# Patient Record
Sex: Male | Born: 1985 | Race: White | Hispanic: No | Marital: Married | Smoking: Current every day smoker
Health system: Southern US, Community
[De-identification: ages and names within clinical notes are randomized; demographics above are authoritative.]

## PROBLEM LIST (undated history)

## (undated) HISTORY — PX: TYMPANOSTOMY TUBE PLACEMENT: SHX32

---

## 2010-10-26 ENCOUNTER — Emergency Department (INDEPENDENT_AMBULATORY_CARE_PROVIDER_SITE_OTHER): Payer: Self-pay

## 2010-10-26 ENCOUNTER — Emergency Department (HOSPITAL_BASED_OUTPATIENT_CLINIC_OR_DEPARTMENT_OTHER)
Admission: EM | Admit: 2010-10-26 | Discharge: 2010-10-26 | Disposition: A | Discharge status: Home / Self Care | Payer: Self-pay | Attending: Emergency Medicine | Admitting: Emergency Medicine

## 2010-10-26 DIAGNOSIS — R0789 Other chest pain: Secondary | ICD-10-CM | POA: Insufficient documentation

## 2010-10-26 DIAGNOSIS — R079 Chest pain, unspecified: Secondary | ICD-10-CM

## 2010-10-26 DIAGNOSIS — R071 Chest pain on breathing: Secondary | ICD-10-CM | POA: Insufficient documentation

## 2010-10-26 LAB — CK TOTAL AND CKMB (NOT AT ARMC)
CK, MB: 1.4 ng/mL (ref 0.3–4.0)
Relative Index: INVALID (ref 0.0–2.5)

## 2010-10-26 LAB — COMPREHENSIVE METABOLIC PANEL
AST: 20 U/L (ref 0–37)
Albumin: 4.6 g/dL (ref 3.5–5.2)
Calcium: 10.2 mg/dL (ref 8.4–10.5)
Creatinine, Ser: 0.9 mg/dL (ref 0.4–1.5)
Sodium: 139 mEq/L (ref 135–145)
Total Protein: 7.4 g/dL (ref 6.0–8.3)

## 2010-10-26 LAB — TROPONIN I: Troponin I: <0.3 ng/mL (ref <0.30)

## 2010-10-26 LAB — CBC
MCH: 30.8 pg (ref 26.0–34.0)
Platelets: 259 10*3/uL (ref 150–400)
RBC: 5.2 MIL/uL (ref 4.22–5.81)
WBC: 7.1 10*3/uL (ref 4.0–10.5)

## 2010-11-03 ENCOUNTER — Emergency Department (HOSPITAL_BASED_OUTPATIENT_CLINIC_OR_DEPARTMENT_OTHER)
Admission: EM | Admit: 2010-11-03 | Discharge: 2010-11-03 | Disposition: A | Discharge status: Home / Self Care | Payer: Self-pay | Attending: Emergency Medicine | Admitting: Emergency Medicine

## 2010-11-03 DIAGNOSIS — R0789 Other chest pain: Secondary | ICD-10-CM | POA: Insufficient documentation

## 2010-11-03 DIAGNOSIS — R071 Chest pain on breathing: Secondary | ICD-10-CM | POA: Insufficient documentation

## 2010-11-03 DIAGNOSIS — F17200 Nicotine dependence, unspecified, uncomplicated: Secondary | ICD-10-CM | POA: Insufficient documentation

## 2012-11-28 ENCOUNTER — Emergency Department (HOSPITAL_BASED_OUTPATIENT_CLINIC_OR_DEPARTMENT_OTHER): Payer: Self-pay

## 2012-11-28 ENCOUNTER — Encounter (HOSPITAL_BASED_OUTPATIENT_CLINIC_OR_DEPARTMENT_OTHER): Payer: Self-pay | Admitting: *Deleted

## 2012-11-28 ENCOUNTER — Emergency Department (HOSPITAL_BASED_OUTPATIENT_CLINIC_OR_DEPARTMENT_OTHER)
Admission: EM | Admit: 2012-11-28 | Discharge: 2012-11-28 | Disposition: A | Discharge status: Home / Self Care | Payer: Self-pay | Attending: Emergency Medicine | Admitting: Emergency Medicine

## 2012-11-28 DIAGNOSIS — F17200 Nicotine dependence, unspecified, uncomplicated: Secondary | ICD-10-CM | POA: Insufficient documentation

## 2012-11-28 DIAGNOSIS — M25579 Pain in unspecified ankle and joints of unspecified foot: Secondary | ICD-10-CM | POA: Insufficient documentation

## 2012-11-28 DIAGNOSIS — M79671 Pain in right foot: Secondary | ICD-10-CM

## 2012-11-28 MED ORDER — IBUPROFEN 800 MG PO TABS: 800.0000 mg | ORAL_TABLET | Freq: Three times a day (TID) | ORAL | Status: DC

## 2012-11-28 MED ORDER — IBUPROFEN 800 MG PO TABS
800.0000 mg | ORAL_TABLET | Freq: Once | ORAL | Status: AC
  Administered 2012-11-28: 800 mg via ORAL
  Filled 2012-11-28: qty 1

## 2012-11-28 MED ORDER — HYDROCODONE-ACETAMINOPHEN 5-325 MG PO TABS: 2.0000 | ORAL_TABLET | ORAL | Status: DC | PRN

## 2012-11-28 NOTE — ED Provider Notes (Signed)
History  This chart was scribed for Ian Octave, MD, by Candelaria Stagers, ED Scribe. This patient was seen in room MH03/[facility]03 and the patient's care was started at 3:09 PM  CSN: [id number] Arrival date & time 11/28/12  1412  First MD Initiated Contact with Patient 11/28/12 1458     Chief Complaint  Patient presents with  . Foot Pain    The history is provided by the patient. No language interpreter was used.   HPI Comments: Ian Villanueva is a 27 y.o. male who presents to the Emergency Department complaining of sudden onset of right heel pain that started yesterday while at work.  He reports the pain is worse with weight bearing.  Pt denies injury, trauma, or heavy lifting.  He has taken nothing for the pain.  Nothing seems to improve or worsen sx.  He has no h/o heel spurs or injury to right foot.   History reviewed. No pertinent past medical history. History reviewed. No pertinent past surgical history. History reviewed. No pertinent family history. History  Substance Use Topics  . Smoking status: Current Every Day Smoker  . Smokeless tobacco: Not on file  . Alcohol Use: Yes     Comment: occ    Review of Systems  Musculoskeletal: Positive for arthralgias (right heel pain).  All other systems reviewed and are negative.    Allergies  Review of patient's allergies indicates no known allergies.  Home Medications   Current Outpatient Rx  Name  Route  Sig  Dispense  Refill  . HYDROcodone-acetaminophen (NORCO/VICODIN) 5-325 MG per tablet   Oral   Take 2 tablets by mouth every 4 (four) hours as needed for pain.   10 tablet   0   . ibuprofen (ADVIL,MOTRIN) 800 MG tablet   Oral   Take 1 tablet (800 mg total) by mouth 3 (three) times daily.   21 tablet   0    BP 146/88  Pulse 84  Temp(Src) 97.9 F (36.6 C) (Oral)  Resp 20  Ht 5\' 9"  (1.753 m)  Wt 195 lb (88.451 kg)  BMI 28.78 kg/m2  SpO2 99% Physical Exam  Nursing note and vitals reviewed. Constitutional:  He is oriented to person, place, and time. He appears well-developed and well-nourished. No distress.  HENT:  Head: Normocephalic and atraumatic.  Eyes: EOM are normal.  Neck: Neck supple. No tracheal deviation present.  Cardiovascular: Normal rate.   Pulmonary/Chest: Effort normal. No respiratory distress.  Musculoskeletal: Normal range of motion.  No skin changes.  Tender to palpation over right heel.  Achilles tendon intact.  +2 DP and PT pulse.  Able to wiggle toes.    Neurological: He is alert and oriented to person, place, and time.  Skin: Skin is warm and dry.  Psychiatric: He has a normal mood and affect. His behavior is normal.    ED Course  Procedures  DIAGNOSTIC STUDIES: Oxygen Saturation is 99% on room air, normal by my interpretation.    COORDINATION OF CARE:  3:10 PM Discussed course of care with pt which includes images of right foot.  Pt understands and agrees.   4:07 PM Discussed images with pt.    Labs Reviewed - No data to display Dg Foot Complete Right  11/28/2012   *RADIOLOGY REPORT*  Clinical Data: Right foot pain beginning 1 day ago.  RIGHT FOOT COMPLETE - 3+ VIEW  Comparison: None.  Findings: No acute bony or joint abnormality is identified.  Mild first MTP degenerative change  is seen.  Soft tissue structures are unremarkable.  IMPRESSION:  1.  No acute finding.  2.  Mild first MTP degenerative disease.   Original Report Authenticated By: Holley Dexter, M.D.   1. Heel pain, right     MDM  2 days of atraumatic right heel pain without injury. Worse with weightbearing. No fevers, chills, vomiting, or other joint pain.  No abnormalities on exam. Neurovascular intact. Suspect calcaneal spur versus plantar fasciitis  X-ray negative. Will treat supportively for possible fasciitis. Patient given postop shoe anti-inflammatories, sports medicine followup.   I personally performed the services described in this documentation, which was scribed in my presence.  The recorded information has been reviewed and is accurate.    Ian Octave, MD 11/28/12 [phone]

## 2012-11-28 NOTE — ED Notes (Signed)
Right heel pain onset yesterday denies injury

## 2012-12-23 ENCOUNTER — Emergency Department (HOSPITAL_BASED_OUTPATIENT_CLINIC_OR_DEPARTMENT_OTHER)
Admission: EM | Admit: 2012-12-23 | Discharge: 2012-12-23 | Disposition: A | Discharge status: Home / Self Care | Payer: Self-pay | Attending: Emergency Medicine | Admitting: Emergency Medicine

## 2012-12-23 ENCOUNTER — Encounter (HOSPITAL_BASED_OUTPATIENT_CLINIC_OR_DEPARTMENT_OTHER): Payer: Self-pay | Admitting: *Deleted

## 2012-12-23 DIAGNOSIS — IMO0002 Reserved for concepts with insufficient information to code with codable children: Secondary | ICD-10-CM | POA: Insufficient documentation

## 2012-12-23 DIAGNOSIS — Y939 Activity, unspecified: Secondary | ICD-10-CM | POA: Insufficient documentation

## 2012-12-23 DIAGNOSIS — T798XXA Other early complications of trauma, initial encounter: Secondary | ICD-10-CM

## 2012-12-23 DIAGNOSIS — S91309A Unspecified open wound, unspecified foot, initial encounter: Secondary | ICD-10-CM | POA: Insufficient documentation

## 2012-12-23 DIAGNOSIS — Y929 Unspecified place or not applicable: Secondary | ICD-10-CM | POA: Insufficient documentation

## 2012-12-23 DIAGNOSIS — F17200 Nicotine dependence, unspecified, uncomplicated: Secondary | ICD-10-CM | POA: Insufficient documentation

## 2012-12-23 MED ORDER — SULFAMETHOXAZOLE-TRIMETHOPRIM 800-160 MG PO TABS: 1.0000 | ORAL_TABLET | Freq: Two times a day (BID) | ORAL | Status: DC

## 2012-12-23 MED ORDER — HYDROCODONE-ACETAMINOPHEN 5-325 MG PO TABS
2.0000 | ORAL_TABLET | Freq: Once | ORAL | Status: AC
  Administered 2012-12-23: 2 via ORAL
  Filled 2012-12-23: qty 2

## 2012-12-23 MED ORDER — HYDROCODONE-ACETAMINOPHEN 5-325 MG PO TABS: 2.0000 | ORAL_TABLET | ORAL | Status: DC | PRN

## 2012-12-23 NOTE — ED Notes (Signed)
Pt c/o right top of foot pain x 6 hrs  , redness noted to small abrasion

## 2012-12-23 NOTE — ED Provider Notes (Signed)
  CSN: [id number]     Arrival date & time 12/23/12  1311 History     First MD Initiated Contact with Patient 12/23/12 1318     Chief Complaint  Patient presents with  . Foot Pain   (Consider location/radiation/quality/duration/timing/severity/associated sxs/prior Treatment) HPI Comments: Patient presents to the ER for evaluation of pain and swelling of his right foot. Patient reports that he hit his foot on a bed frame yesterday and scraped the top of the foot. Today he has noticed some swelling around the site and the pain has progressively worsened. He reports that it hurts when he tries to flex his foot.  Patient is a 27 y.o. male presenting with lower extremity pain.  Foot Pain    History reviewed. No pertinent past medical history. Past Surgical History  Procedure Laterality Date  . Tympanostomy tube placement     History reviewed. No pertinent family history. History  Substance Use Topics  . Smoking status: Current Every Day Smoker -- 0.50 packs/day    Types: Cigarettes  . Smokeless tobacco: Not on file  . Alcohol Use: Yes     Comment: occ    Review of Systems  Musculoskeletal: Positive for joint swelling.  Skin: Positive for wound.    Allergies  Review of patient's allergies indicates no known allergies.  Home Medications  No current outpatient prescriptions on file. BP 146/89  Pulse 80  Temp(Src) 97.9 F (36.6 C) (Oral)  Resp 18  Ht 5\' 9"  (1.753 m)  Wt 195 lb (88.451 kg)  BMI 28.78 kg/m2  SpO2 99% Physical Exam  Constitutional: He is oriented to person, place, and time.  HENT:  Right Ear: Hearing normal.  Left Ear: Hearing normal.  Nose: Nose normal.  Mouth/Throat: Mucous membranes are normal.  Cardiovascular: S1 normal and S2 normal.   Pulmonary/Chest: Effort normal.  Abdominal: Normal appearance. There is no hepatosplenomegaly. There is no tenderness at McBurney's point and negative Murphy's sign. No hernia.  Musculoskeletal: Normal range of  motion.  Neurological: He is alert and oriented to person, place, and time. He has normal strength. No cranial nerve deficit or sensory deficit. Coordination normal. GCS eye subscore is 4. GCS verbal subscore is 5. GCS motor subscore is 6.  Skin: Skin is warm, dry and intact. No rash noted. No cyanosis.     Psychiatric: He has a normal mood and affect. His speech is normal and behavior is normal. Thought content normal.    ED Course   Procedures (including critical care time)  Labs Reviewed - No data to display No results found.  Diagnosis: Cellulitis, wound infection  MDM  Presents to the ER for evaluation of pain and swelling of his right foot. Patient suffered a superficial abrasion yesterday and now there is some surrounding erythema and pain. Pain significantly worsens if he tries to actively flex the ankle. There does not appear to be any abscess or deep infection. Symptoms are consistent with a cellulitis/wound infection. Patient to be initiated on Bactrim, pain medication and rest.  Gilda Crease, MD 12/23/12 [phone]

## 2013-01-10 ENCOUNTER — Encounter (HOSPITAL_BASED_OUTPATIENT_CLINIC_OR_DEPARTMENT_OTHER): Payer: Self-pay | Admitting: *Deleted

## 2013-01-10 ENCOUNTER — Emergency Department (HOSPITAL_BASED_OUTPATIENT_CLINIC_OR_DEPARTMENT_OTHER): Payer: Self-pay

## 2013-01-10 ENCOUNTER — Emergency Department (HOSPITAL_BASED_OUTPATIENT_CLINIC_OR_DEPARTMENT_OTHER)
Admission: EM | Admit: 2013-01-10 | Discharge: 2013-01-10 | Disposition: A | Discharge status: Home / Self Care | Payer: Self-pay | Attending: Emergency Medicine | Admitting: Emergency Medicine

## 2013-01-10 DIAGNOSIS — M109 Gout, unspecified: Secondary | ICD-10-CM | POA: Insufficient documentation

## 2013-01-10 DIAGNOSIS — R609 Edema, unspecified: Secondary | ICD-10-CM | POA: Insufficient documentation

## 2013-01-10 DIAGNOSIS — F17200 Nicotine dependence, unspecified, uncomplicated: Secondary | ICD-10-CM | POA: Insufficient documentation

## 2013-01-10 LAB — BASIC METABOLIC PANEL
CO2: 27 mEq/L (ref 19–32)
Chloride: 103 mEq/L (ref 96–112)
Creatinine, Ser: 0.8 mg/dL (ref 0.50–1.35)
Sodium: 139 mEq/L (ref 135–145)

## 2013-01-10 LAB — CBC WITH DIFFERENTIAL/PLATELET
Basophils Absolute: 0 10*3/uL (ref 0.0–0.1)
HCT: 40 % (ref 39.0–52.0)
Hemoglobin: 14.5 g/dL (ref 13.0–17.0)
Lymphocytes Relative: 31 % (ref 12–46)
Monocytes Absolute: 0.5 10*3/uL (ref 0.1–1.0)
Neutro Abs: 3.4 10*3/uL (ref 1.7–7.7)
RBC: 4.62 MIL/uL (ref 4.22–5.81)
RDW: 11.8 % (ref 11.5–15.5)
WBC: 6.1 10*3/uL (ref 4.0–10.5)

## 2013-01-10 MED ORDER — PREDNISONE 10 MG PO TABS: ORAL_TABLET | ORAL | Status: DC

## 2013-01-10 MED ORDER — OXYCODONE-ACETAMINOPHEN 5-325 MG PO TABS
2.0000 | ORAL_TABLET | Freq: Once | ORAL | Status: AC
  Administered 2013-01-10: 2 via ORAL
  Filled 2013-01-10 (×2): qty 2

## 2013-01-10 MED ORDER — HYDROCODONE-ACETAMINOPHEN 5-325 MG PO TABS: 2.0000 | ORAL_TABLET | ORAL | Status: DC | PRN

## 2013-01-10 NOTE — ED Provider Notes (Signed)
CSN: [id number]     Arrival date & time 01/10/13  1247 History     First MD Initiated Contact with Patient 01/10/13 1305     Chief Complaint  Patient presents with  . Foot Pain   (Consider location/radiation/quality/duration/timing/severity/associated sxs/prior Treatment) HPI  History reviewed. No pertinent past medical history. Past Surgical History  Procedure Laterality Date  . Tympanostomy tube placement     History reviewed. No pertinent family history. History  Substance Use Topics  . Smoking status: Current Every Day Smoker -- 0.50 packs/day    Types: Cigarettes  . Smokeless tobacco: Not on file  . Alcohol Use: Yes     Comment: occ    Review of Systems  Allergies  Review of patient's allergies indicates no known allergies.  Home Medications   Current Outpatient Rx  Name  Route  Sig  Dispense  Refill  . HYDROcodone-acetaminophen (NORCO/VICODIN) 5-325 MG per tablet   Oral   Take 2 tablets by mouth every 4 (four) hours as needed for pain.   10 tablet   0   . sulfamethoxazole-trimethoprim (SEPTRA DS) 800-160 MG per tablet   Oral   Take 1 tablet by mouth 2 (two) times daily.   20 tablet   0    BP 145/94  Pulse 90  Temp(Src) 98.5 F (36.9 C) (Oral)  Resp 20  Ht 5\' 9"  (1.753 m)  Wt 200 lb (90.719 kg)  BMI 29.52 kg/m2  SpO2 100% Physical Exam  ED Course   Procedures (including critical care time)  Labs Reviewed  CBC WITH DIFFERENTIAL - Abnormal; Notable for the following:    MCHC 36.3 (*)    Eosinophils Relative 6 (*)    All other components within normal limits  BASIC METABOLIC PANEL - Abnormal; Notable for the following:    Glucose, Bld 124 (*)    All other components within normal limits   US Venous Img Lower Unilateral Right  01/10/2013   CLINICAL DATA:  Right foot pain.  Swelling.  EXAM: RIGHT LOWER EXTREMITY VENOUS ULTRASOUND  TECHNIQUE: Gray-scale sonography with graded compression, as well as color Doppler and duplex ultrasound, were  performed to evaluate the deep venous system from the level of the common femoral vein through the popliteal and proximal calf veins. Spectral Doppler was utilized to evaluate flow at rest and with distal augmentation maneuvers.  COMPARISON:  None.  FINDINGS: Normal compressibility of the common femoral, superficial femoral, and popliteal veins is demonstrated, as well as the visualized proximal calf veins. No filling defects to suggest DVT on grayscale or color Doppler imaging. Doppler waveforms show normal direction of venous flow, normal respiratory phasicity and response to augmentation.  IMPRESSION: No evidence of lower extremity deep vein thrombosis.   Electronically Signed   By: Gordan Payment   On: 01/10/2013 15:27   Dg Foot Complete Right  01/10/2013   CLINICAL DATA:  Right great toe pain and swelling.  EXAM: RIGHT FOOT COMPLETE - 3+ VIEW  COMPARISON:  11/28/2012.  FINDINGS: Joint space narrowing and spur formation on both sides of the lateral aspect of the 1st MTP joint. There is also a small area of focal low density in the medial aspect of the 1st metatarsal head. The cortex appears grossly intact.  IMPRESSION: Degenerative changes involving the 1st MTP joint and possible small developing erosion in the 1st metatarsal head.   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  Probable gout.    Pt given rx for prednisone and hydrocodone.   Pt referred to Dr. Pearletha Forge for follow up.    Lonia Skinner [place], PA-C 01/10/13 1557

## 2013-01-10 NOTE — ED Provider Notes (Signed)
CSN: [id number]     Arrival date & time 01/10/13  1247 History     First MD Initiated Contact with Patient 01/10/13 1305     Chief Complaint  Patient presents with  . Foot Pain   (Consider location/radiation/quality/duration/timing/severity/associated sxs/prior Treatment) Patient is a 27 y.o. male presenting with lower extremity pain. The history is provided by the patient.  Foot Pain This is a new problem. The current episode started more than 1 week ago. The problem occurs constantly. The problem has not changed since onset.Pertinent negatives include no chest pain, no abdominal pain, no headaches and no shortness of breath. The symptoms are aggravated by walking and standing. Nothing relieves the symptoms. He has tried nothing for the symptoms.   Patient states he was seen here about 3 weeks ago for foot pain at that time was 20 plantar fasciitis. He wore the foot immobilizer he was given and symptoms improved. He quit wearing it has been back at work for a week and states now his whole foot hurts and is not just the bottom and the heel but also on the top. He notes some swelling. He denies any calf or leg pain, chest pain, or dyspnea. He has no history of DVT or pulmonary embolism. There is no redness, warmth, or fever. He does not have any history of diabetes or other immunodeficient states. History reviewed. No pertinent past medical history. Past Surgical History  Procedure Laterality Date  . Tympanostomy tube placement     History reviewed. No pertinent family history. History  Substance Use Topics  . Smoking status: Current Every Day Smoker -- 0.50 packs/day    Types: Cigarettes  . Smokeless tobacco: Not on file  . Alcohol Use: Yes     Comment: occ    Review of Systems  Respiratory: Negative for shortness of breath.   Cardiovascular: Negative for chest pain.  Gastrointestinal: Negative for abdominal pain.  Neurological: Negative for headaches.  All other systems reviewed  and are negative.    Allergies  Review of patient's allergies indicates no known allergies.  Home Medications   Current Outpatient Rx  Name  Route  Sig  Dispense  Refill  . HYDROcodone-acetaminophen (NORCO/VICODIN) 5-325 MG per tablet   Oral   Take 2 tablets by mouth every 4 (four) hours as needed for pain.   10 tablet   0   . sulfamethoxazole-trimethoprim (SEPTRA DS) 800-160 MG per tablet   Oral   Take 1 tablet by mouth 2 (two) times daily.   20 tablet   0    BP 145/94  Pulse 90  Temp(Src) 98.5 F (36.9 C) (Oral)  Resp 20  Ht 5\' 9"  (1.753 m)  Wt 200 lb (90.719 kg)  BMI 29.52 kg/m2  SpO2 100% Physical Exam  Nursing note and vitals reviewed. Constitutional: He is oriented to person, place, and time. He appears well-developed and well-nourished.  HENT:  Head: Normocephalic and atraumatic.  Musculoskeletal: He exhibits edema and tenderness.  Pain and tenderness to right great toe first joint, mild diffuse ttp and edema of dorsal aspect of right foot  Neurological: He is alert and oriented to person, place, and time.  Skin: Skin is warm and dry.  Psychiatric: He has a normal mood and affect. His behavior is normal. Thought content normal.    ED Course   ARTHOCENTESIS Date/Time: 01/10/2013 3:22 PM Performed by: Hilario Quarry Authorized by: Hilario Quarry Consent: Verbal consent obtained. written consent obtained. Risks and benefits: risks,  benefits and alternatives were discussed Consent given by: patient Patient understanding: patient states understanding of the procedure being performed Patient consent: the patient's understanding of the procedure matches consent given Patient identity confirmed: verbally with patient Time out: Immediately prior to procedure a "time out" was called to verify the correct patient, procedure, equipment, support staff and site/side marked as required. Indications: joint swelling and pain  Body area: toe Location details: right  big toe Joint: right big MTP Local anesthesia used: yes Anesthesia: local infiltration Local anesthetic: lidocaine 1% with epinephrine Anesthetic total: 2 ml Needle gauge: 18 G Approach: medial Aspirate amount: 0 ml Patient tolerance: Patient tolerated the procedure well with no immediate complications.   (including critical care time)  Labs Reviewed  CBC WITH DIFFERENTIAL - Abnormal; Notable for the following:    MCHC 36.3 (*)    Eosinophils Relative 6 (*)    All other components within normal limits  BASIC METABOLIC PANEL - Abnormal; Notable for the following:    Glucose, Bld 124 (*)    All other components within normal limits   No results found. No diagnosis found.  MDM  27 year old male with right foot pain and swelling pain greatest at right first metacarpal joint. Aspiration attempted with no fluid found. The patient had ultrasound and x-Roderica Cathell ordered. Trisha Mangle PA-C will disposition the patient.  Hilario Quarry, MD 01/10/13 [phone]

## 2013-01-10 NOTE — ED Notes (Signed)
Pt reports (R) foot pain x 3 weeks.  Noted to have a swollen (R) foot, no redness noted.  Pt ambulatory.  Reports that he was told that he had an infection in his foot.  Pt reports that he was rx antibiotics and that he stopped taking it because it wasn't helping.

## 2013-01-10 NOTE — ED Notes (Signed)
Tray with supplies placed outside of room.

## 2013-01-13 NOTE — ED Provider Notes (Signed)
History/physical exam/procedure(s) were performed by non-physician practitioner and as supervising physician I was immediately available for consultation/collaboration. I have reviewed all notes and am in agreement with care and plan.   Hilario Quarry, MD 01/13/13 [phone]

## 2015-03-18 ENCOUNTER — Encounter (HOSPITAL_BASED_OUTPATIENT_CLINIC_OR_DEPARTMENT_OTHER): Payer: Self-pay | Admitting: *Deleted

## 2015-03-18 ENCOUNTER — Emergency Department (HOSPITAL_BASED_OUTPATIENT_CLINIC_OR_DEPARTMENT_OTHER)
Admission: EM | Admit: 2015-03-18 | Discharge: 2015-03-18 | Disposition: A | Discharge status: Home / Self Care | Payer: Self-pay | Attending: Emergency Medicine | Admitting: Emergency Medicine

## 2015-03-18 ENCOUNTER — Emergency Department (HOSPITAL_BASED_OUTPATIENT_CLINIC_OR_DEPARTMENT_OTHER): Payer: Self-pay

## 2015-03-18 DIAGNOSIS — M791 Myalgia: Secondary | ICD-10-CM | POA: Insufficient documentation

## 2015-03-18 DIAGNOSIS — R52 Pain, unspecified: Secondary | ICD-10-CM

## 2015-03-18 DIAGNOSIS — Z720 Tobacco use: Secondary | ICD-10-CM | POA: Insufficient documentation

## 2015-03-18 DIAGNOSIS — J020 Streptococcal pharyngitis: Secondary | ICD-10-CM | POA: Insufficient documentation

## 2015-03-18 DIAGNOSIS — J029 Acute pharyngitis, unspecified: Secondary | ICD-10-CM

## 2015-03-18 LAB — RAPID STREP SCREEN (MED CTR MEBANE ONLY): Streptococcus, Group A Screen (Direct): POSITIVE — AB

## 2015-03-18 MED ORDER — IBUPROFEN 800 MG PO TABS
800.0000 mg | ORAL_TABLET | Freq: Once | ORAL | Status: AC
  Administered 2015-03-18: 800 mg via ORAL
  Filled 2015-03-18: qty 1

## 2015-03-18 MED ORDER — IBUPROFEN 800 MG PO TABS: 800.0000 mg | ORAL_TABLET | Freq: Three times a day (TID) | ORAL | Status: AC

## 2015-03-18 MED ORDER — AMOXICILLIN 500 MG PO CAPS: 1000.0000 mg | ORAL_CAPSULE | Freq: Every day | ORAL | Status: AC

## 2015-03-18 MED ORDER — BENZONATATE 100 MG PO CAPS: 100.0000 mg | ORAL_CAPSULE | Freq: Three times a day (TID) | ORAL | Status: AC

## 2015-03-18 NOTE — ED Notes (Signed)
C/o sore throat, body aches, congestion since this morning

## 2015-03-18 NOTE — ED Provider Notes (Signed)
CSN: [id number]     Arrival date & time 03/18/15  1547 History   First MD Initiated Contact with Patient 03/18/15 1607     Chief Complaint  Patient presents with  . Sore Throat     (Consider location/radiation/quality/duration/timing/severity/associated sxs/prior Treatment) HPI   Ian Villanueva is a 29 y.o. male, pt with no pertinent past medical history, presents with sore throat, body aches, chills, and productive cough with green sputum since this morning. Pt rates sore throat as 8/10, stinging, and non-radiating. Pt has not tried anything for pain. Denies shortness of breath, chest pain, N/V/C/D, fever, abdominal pain, or any other pain or complaints.     History reviewed. No pertinent past medical history. Past Surgical History  Procedure Laterality Date  . Tympanostomy tube placement     No family history on file. Social History  Substance Use Topics  . Smoking status: Current Every Day Smoker -- 0.50 packs/day    Types: Cigarettes  . Smokeless tobacco: Never Used  . Alcohol Use: Yes     Comment: 2x week    Review of Systems  Constitutional: Positive for chills. Negative for fever and diaphoresis.  HENT: Positive for sore throat.   Respiratory: Positive for cough. Negative for shortness of breath and wheezing.   Cardiovascular: Negative for chest pain.  Gastrointestinal: Negative for nausea, vomiting, abdominal pain, diarrhea and constipation.  Musculoskeletal: Positive for myalgias.  Skin: Negative for color change and pallor.  Neurological: Negative for dizziness, weakness and headaches.  All other systems reviewed and are negative.     Allergies  Review of patient's allergies indicates no known allergies.  Home Medications   Prior to Admission medications   Medication Sig Start Date End Date Taking? Authorizing Provider  amoxicillin (AMOXIL) 500 MG capsule Take 2 capsules (1,000 mg total) by mouth daily. 03/18/15   Shawn C Joy, PA-C  benzonatate  (TESSALON) 100 MG capsule Take 1 capsule (100 mg total) by mouth every 8 (eight) hours. 03/18/15   Shawn C Joy, PA-C  ibuprofen (ADVIL,MOTRIN) 800 MG tablet Take 1 tablet (800 mg total) by mouth 3 (three) times daily. 03/18/15   Shawn C Joy, PA-C   BP 140/86 mmHg  Pulse 97  Temp(Src) 98.6 F (37 C) (Oral)  Resp 20  Ht  (1.753 m)  Wt 195 lb (88.451 kg)  BMI 28.78 kg/m2  SpO2 98% Physical Exam  Constitutional: He appears well-developed and well-nourished. No distress.  HENT:  Head: Normocephalic and atraumatic.  Right Ear: Hearing, tympanic membrane, external ear and ear canal normal.  Left Ear: Hearing, tympanic membrane, external ear and ear canal normal.  Posterior pharynx erythematous bilaterally. Uvula midline. No exudate or discharge noted. No voice changes. No signs of abscess.   Eyes: Conjunctivae are normal. Pupils are equal, round, and reactive to light.  Cardiovascular: Normal rate, regular rhythm and normal heart sounds.   Pulmonary/Chest: Effort normal and breath sounds normal. No respiratory distress.  Abdominal: Soft. Bowel sounds are normal.  Musculoskeletal: He exhibits no edema or tenderness.  Neurological: He is alert.  Skin: Skin is warm and dry. He is not diaphoretic.  Nursing note and vitals reviewed.   ED Course  Procedures (including critical care time) Labs Review Labs Reviewed  RAPID STREP SCREEN (NOT AT [facility]) - Abnormal; Notable for the following:    Streptococcus, Group A Screen (Direct) POSITIVE (*)    All other components within normal limits    Imaging Review Dg Chest 2 View  03/18/2015  CLINICAL DATA:  Productive cough with sore throat and body aches today. Smoker. Initial encounter. EXAM: CHEST  2 VIEW COMPARISON:  10/26/2010 radiographs. FINDINGS: The heart size and mediastinal contours are stable. The lungs demonstrate diffuse central airway thickening, but no hyperinflation or confluent airspace opacity. There is no pleural effusion or  pneumothorax. The bones appear unremarkable. IMPRESSION: Diffuse central airway thickening consistent with bronchitis. No evidence of pneumonia. Electronically Signed   By: Carey BullocksWilliam  Veazey M.D.   On: 03/18/2015 16:44   I have personally reviewed and evaluated these images and lab results as part of my medical decision-making.   EKG Interpretation None      MDM   Final diagnoses:  Sore throat  Body aches  Strep pharyngitis    Ian Villanueva presents with sore throat, body aches, and congestion since this morning.  Rapid strep test positive. Will await CXR results and then discharge pt with ABX and instructions for supportive care. Plan of care communicated to pt, who agreed to plan. CXR consistent with possible bronchitis, which is likely viral. Pt to follow up with PCP on this complaint.    Anselm PancoastShawn C Joy, PA-C 03/18/15 1708  Marily MemosJason Mesner, MD 03/18/15 2252

## 2015-03-18 NOTE — Discharge Instructions (Signed)
You have been seen today for a sore throat. You have strep throat. Take all antibiotics until finished. Follow up with PCP as needed. Return to ED should symptoms worsen. Drink plenty of fluids and get plenty of rest. Sore throat can be treated symptomatically with chloraseptic spray and ibuprofen.   Emergency Department Resource Guide 1) Find a Doctor and Pay Out of Pocket Although you won't have to find out who is covered by your insurance plan, it is a good idea to ask around and get recommendations. You will then need to call the office and see if the doctor you have chosen will accept you as a new patient and what types of options they offer for patients who are self-pay. Some doctors offer discounts or will set up payment plans for their patients who do not have insurance, but you will need to ask so you aren't surprised when you get to your appointment.  2) Contact Your Local Health Department Not all health departments have doctors that can see patients for sick visits, but many do, so it is worth a call to see if yours does. If you don't know where your local health department is, you can check in your phone book. The CDC also has a tool to help you locate your state's health department, and many state websites also have listings of all of their local health departments.  3) Find a Walk-in Clinic If your illness is not likely to be very severe or complicated, you may want to try a walk in clinic. These are popping up all over the country in pharmacies, drugstores, and shopping centers. They're usually staffed by nurse practitioners or physician assistants that have been trained to treat common illnesses and complaints. They're usually fairly quick and inexpensive. However, if you have serious medical issues or chronic medical problems, these are probably not your best option.  No Primary Care Doctor: - Call Health Connect at  [phone] - they can help you locate a primary care doctor that   accepts your insurance, provides certain services, etc. - Physician Referral Service- [phone]  Chronic Pain Problems: Organization         Address  Phone   Notes  [facility]  [phone] Patients need to be referred by their primary care doctor.   Medication Assistance: Organization         Address  Phone   Notes  [facility] [address] [place], [place] [id number] [phone]) [phone] --Must be a resident of [place] [place] -- Must have NO insurance coverage whatsoever (no Medicaid/ Medicare, etc.) -- The pt. MUST have a primary care doctor that directs their care regularly and follows them in the community   Med[facility]ist  [phone]   [employer]  [phone]    Agencies that provide inexpensive medical care: Organization         Address  Phone   Notes  [facility]  [phone]   [facility]    [phone]   [facility] [address] [place], [place] [address] [phone]) [phone]   [facility] [address], [place] [phone]   [facility]anned Parenthood    [phone]) [phone]   [facility]    [phone]   [facility]  [address], [place] Phone:  [phone], Fax:  [phone] Hours of Operation:  9  am - 6 pm, M-F.  Also accepts Medicaid/Medicare and self-pay.  [facility] for [place] [address], [address], [place] Phone: [phone], Fax: [phone]. Hours of Operation:  8:30 am - 5:30 pm, M-F.  Also accepts Medicaid and self-pay.  [facility] [place] [address], [place] Phone: [phone]   [place], [place], [place] [phone], Ext. [phone] Mondays & Thursdays: 7-9 AM.  First 15 patients are seen on a first come, first serve basis.    [place] Providers:  Organization          Address  Phone   Notes  [facility] [address], [place] [phone] Also accepts self-pay patients.  [facility] [address], [place]  [phone]   [place], [address], [place] [phone]   [facility] [address], [place] [phone]   Lucianne Lei [address], [place]   [phone] Only accepts [place] Access [place] patients after they have their name applied to their card.   Self-Pay (no insurance) in [facility]:  Organization         Address  Phone   Notes  Sickle Cell Patients, [facility] [place] [phone]   [facility] [place] [phone]   [facility] [place]  [place], [place], [place] [phone]   [facility]/Dr. Osei-Bonsu  [address], [place] or [address], [place] [phone] Phone number for both [place] and [place] locations is the same.  [facility] [address], [place] [phone]   [facility] [address], [place] or [address] [phone]   [facility] [address], [place] [phone], phone; [phone], fax Sees patients 1st and 3rd Saturday of every month.  Must not qualify for public or private insurance (i.e. Medicaid, Medicare, [place] Health Choice, Veterans' Benefits)  Household income should be no more than 200% of the poverty level The clinic cannot treat you if you are pregnant or think you are pregnant  Sexually transmitted diseases are not treated at the clinic.    Dental Care: Organization         Address  Phone  Notes  [facility] [place] [phone] Accepts children up to age 52 who are enrolled in [place] or [place]; pregnant women with a Medicaid card; and children who have applied for Medicaid or [place] Health Choice, but were declined, whose parents can pay a reduced fee at time of service.  [facility] Department of [facility]  [address], [place] [phone] Accepts children up to age 78 who are enrolled in [place] or [place]; pregnant women with a Medicaid card; and children who have applied for Medicaid or  Health Choice, but were declined, whose parents can pay a reduced fee at time of service.  [facility]  [place] [phone] Patients are seen by appointment only. Walk-ins are not accepted. [place] will see patients 18 years of  age and older. Monday - Tuesday (8am-5pm) Most Wednesdays (8:30-5pm) $30 per visit, cash only  [facility] Adult Dental Access PROGRAM  [address], [facility] [phone] Patients are seen by appointment only. Walk-ins are not accepted. [place] will see patients 57 years of age and older. One Wednesday Evening (Monthly: Volunteer Based).  $30 per visit, cash only  [place]  [phone] for adults; Children under age 78, call Graduate Pediatric Dentistry at [phone]. Children aged 43-14, please call [phone] to request a pediatric application.  Dental services are provided in all areas of dental care including fillings, crowns and bridges, complete and partial dentures, implants, gum treatment, root canals, and extractions. Preventive care is also provided. Treatment is provided to both adults and children. Patients are selected via a lottery and there is often a waiting list.   [facility] [address] [place]  [phone] [email or web address]   [facility] [address] [place], [place]  [phone], Ext. [phone] Second and Fourth Thursday of each month, opens at 6:30 AM; Clinic ends at 9 AM.  Patients are seen on a first-come first-served basis, and a limited number are seen during each clinic.   [facility]  [address] Hillard Danker [place], [place] [phone]   Eligibility Requirements You must have lived in [place], [place], or [place] counties for at least the last three months.   You cannot be eligible for state or federal sponsored [employer], including [employer], [place], or [employer].   You generally cannot be eligible for healthcare insurance through your employer.    How to apply: Eligibility screenings are held every Tuesday and Wednesday afternoon from 1:00 pm until 4:00 pm. You do not need an appointment for the interview!  [facility] [address] [place], [place]   [place]  [place] Department  [place]  [phone]    Behavioral Health Resources in the Community: Intensive Outpatient Programs Organization         Address  Phone  Notes  [place]. [address], [place], [place] [phone]   [facility] Outpatient [address], [place], [place]   ADS: Alcohol & Drug Svcs [address] [place], [place]   [place] [address],  [place], [place] or [phone]   Substance Abuse Resources Organization         Address  Phone  Notes  Alcohol and Drug Services  [phone]   [place]  [phone]   [facility] [place]   Chinita Pester  [phone]   Residential & Outpatient Substance Abuse Program  [phone]   Psychological Services Organization         Address  Phone  Notes  [facility] [place]  [place]  [phone]    [facility] [facility] [address] [place] or [phone]    Mobile Crisis Teams Organization         Address  Phone  Notes  [facility], Mobile Crisis Care Unit  [phone]   [facility]  [address] [place], [place]   [facility] [address] [place] [phone]    Self-Help/Support Groups Organization  Address  Phone             Notes  [place]. of [place] - variety of support groups  [place] Call for more information  Narcotics Anonymous (NA), [facility] [address], [employer] [place]  2 meetings at this location   Special educational needs teacher         Address  Phone  Notes  [facility] [place],    [place]  [phone]   [facility]  [address] [place] [address], [place], [place]   [place], [place] [phone] Admissions: 8am-3pm M-F  Incentives Substance [place] [address],    [place], [place] [phone]   [facility] [address] [place], [place], [place]   [facility] [address]  [place], [place]   [facility]ight Programs - Intensive Outpatient [address] Krist[address], [place], [place]   [facility] ([place].,  [place], [place] [phone] or [phone]   [facility] Services (RTS) [address] [place], [place] Accepts Medicaid  [facility] [place] [address]  [place] [phone] Substance Abuse/Addiction Treatment   [facility] Organization         Address  Phone  Notes  [facility]  [phone]   Domenic Schwab, PhD [address] Arlis [place], [place]   [phone] or [phone]   [place]  [place], [place] [phone]   [facility] [address] [place], [place] [phone] Insurance/Medicaid/sponsorship through [facility] and Families [address] [place]                                    [place], [place] [phone] [place] [address], [place] [phone]    Dr. Adele Schilder  [phone]   [facility] Dept. 1) [address], [place] 2) [place] 3)  [address], [place] [phone]  [phone]   [place] [phone] or [phone] (After Hours)

## 2015-03-25 ENCOUNTER — Encounter (HOSPITAL_BASED_OUTPATIENT_CLINIC_OR_DEPARTMENT_OTHER): Payer: Self-pay | Admitting: Emergency Medicine

## 2015-03-25 ENCOUNTER — Emergency Department (HOSPITAL_BASED_OUTPATIENT_CLINIC_OR_DEPARTMENT_OTHER)
Admission: EM | Admit: 2015-03-25 | Discharge: 2015-03-25 | Disposition: A | Discharge status: Home / Self Care | Payer: Self-pay | Attending: Emergency Medicine | Admitting: Emergency Medicine

## 2015-03-25 DIAGNOSIS — M109 Gout, unspecified: Secondary | ICD-10-CM

## 2015-03-25 DIAGNOSIS — Z791 Long term (current) use of non-steroidal anti-inflammatories (NSAID): Secondary | ICD-10-CM | POA: Insufficient documentation

## 2015-03-25 DIAGNOSIS — Z792 Long term (current) use of antibiotics: Secondary | ICD-10-CM | POA: Insufficient documentation

## 2015-03-25 DIAGNOSIS — Z720 Tobacco use: Secondary | ICD-10-CM | POA: Insufficient documentation

## 2015-03-25 DIAGNOSIS — M10071 Idiopathic gout, right ankle and foot: Secondary | ICD-10-CM | POA: Insufficient documentation

## 2015-03-25 MED ORDER — INDOMETHACIN 25 MG PO CAPS: 25.0000 mg | ORAL_CAPSULE | Freq: Three times a day (TID) | ORAL | Status: AC | PRN

## 2015-03-25 MED ORDER — OXYCODONE-ACETAMINOPHEN 5-325 MG PO TABS: 1.0000 | ORAL_TABLET | ORAL | Status: AC | PRN

## 2015-03-25 NOTE — Discharge Instructions (Signed)

## 2015-03-25 NOTE — ED Notes (Signed)
Pt reports right foot pain since yesterday, redness and swelling present, pulses present, ambulatory.  Pt alert and oriented.

## 2015-03-25 NOTE — ED Provider Notes (Signed)
CSN: [id number]     Arrival date & time 03/25/15  1105 History   First MD Initiated Contact with Patient 03/25/15 1110     Chief Complaint  Patient presents with  . Foot Pain     (Consider location/radiation/quality/duration/timing/severity/associated sxs/prior Treatment) Patient is a 29 y.o. male presenting with lower extremity pain.  Foot Pain This is a recurrent problem. The current episode started yesterday. The problem occurs constantly. The problem has not changed since onset.Pertinent negatives include no chest pain, no abdominal pain, no headaches and no shortness of breath. The symptoms are aggravated by walking. Nothing relieves the symptoms.    History reviewed. No pertinent past medical history. Past Surgical History  Procedure Laterality Date  . Tympanostomy tube placement     History reviewed. No pertinent family history. Social History  Substance Use Topics  . Smoking status: Current Every Day Smoker -- 0.50 packs/day    Types: Cigarettes  . Smokeless tobacco: Never Used  . Alcohol Use: Yes     Comment: 2x week    Review of Systems  Respiratory: Negative for shortness of breath.   Cardiovascular: Negative for chest pain.  Gastrointestinal: Negative for abdominal pain.  Neurological: Negative for headaches.  All other systems reviewed and are negative.     Allergies  Review of patient's allergies indicates no known allergies.  Home Medications   Prior to Admission medications   Medication Sig Start Date End Date Taking? Authorizing Provider  amoxicillin (AMOXIL) 500 MG capsule Take 2 capsules (1,000 mg total) by mouth daily. 03/18/15   Shawn C Joy, PA-C  benzonatate (TESSALON) 100 MG capsule Take 1 capsule (100 mg total) by mouth every 8 (eight) hours. 03/18/15   Shawn C Joy, PA-C  ibuprofen (ADVIL,MOTRIN) 800 MG tablet Take 1 tablet (800 mg total) by mouth 3 (three) times daily. 03/18/15   Shawn C Joy, PA-C  indomethacin (INDOCIN) 25 MG capsule Take 1  capsule (25 mg total) by mouth 3 (three) times daily as needed. 03/25/15   Mirian MoMatthew Yahayra Geis, MD  oxyCODONE-acetaminophen (PERCOCET/ROXICET) 5-325 MG tablet Take 1-2 tablets by mouth every 4 (four) hours as needed. 03/25/15   Mirian MoMatthew Saloma Cadena, MD   BP 133/87 mmHg  Pulse 96  Temp(Src) 98.1 F (36.7 C) (Oral)  Resp 18  Ht 5\' 9"  (1.753 m)  Wt 195 lb (88.451 kg)  BMI 28.78 kg/m2  SpO2 100% Physical Exam  Constitutional: He is oriented to person, place, and time. He appears well-developed and well-nourished.  HENT:  Head: Normocephalic and atraumatic.  Eyes: Conjunctivae and EOM are normal.  Neck: Normal range of motion. Neck supple.  Cardiovascular: Normal rate, regular rhythm and normal heart sounds.   Pulmonary/Chest: Effort normal and breath sounds normal. No respiratory distress.  Abdominal: He exhibits no distension. There is no tenderness. There is no rebound and no guarding.  Musculoskeletal: Normal range of motion.  R great toe MTP with warmth, redness.  FROM  Neurological: He is alert and oriented to person, place, and time.  Skin: Skin is warm and dry.  Vitals reviewed.   ED Course  Procedures (including critical care time) Labs Review Labs Reviewed - No data to display  Imaging Review No results found. I have personally reviewed and evaluated these images and lab results as part of my medical decision-making.   EKG Interpretation None      MDM   Final diagnoses:  Acute gout of right foot, unspecified cause    29 y.o. male with pertinent PMH  of prior suspected gout presents with R great toe pain consistent with podagra.  No systemic symptoms.  DC home in stable condition with indomethacin and percocet.    I have reviewed all laboratory and imaging studies if ordered as above  1. Acute gout of right foot, unspecified cause         Mirian Mo, MD 03/25/15 1121

## 2017-03-03 IMAGING — DX DG CHEST 2V
2 series · 2 of 2 positions shown · non-contrast
Comparison: 10/26/2010 radiographs.

CLINICAL DATA: Productive cough with sore throat and body aches
today. Smoker. Initial encounter.

EXAM:
CHEST  2 VIEW

[chest pa]
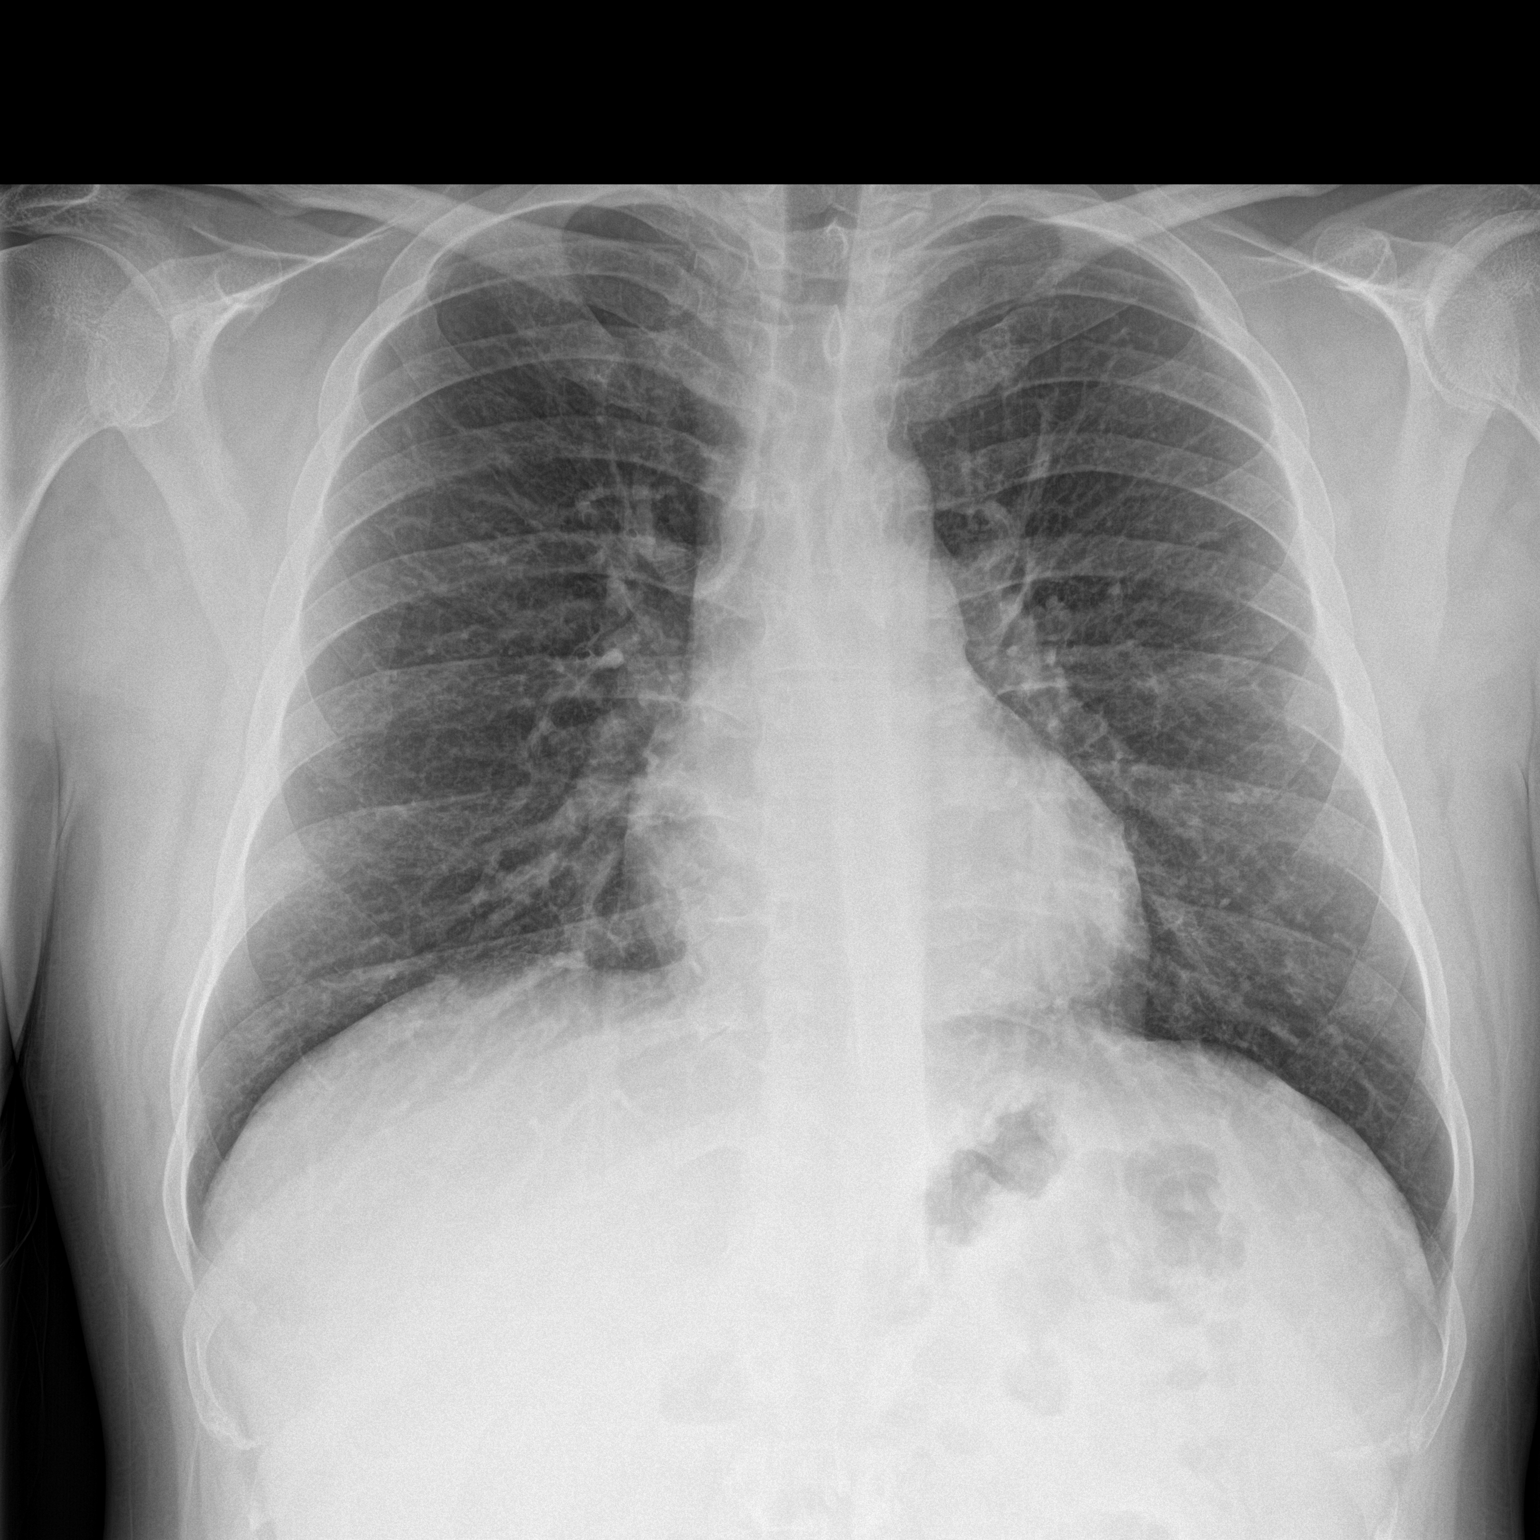

[chest lat]
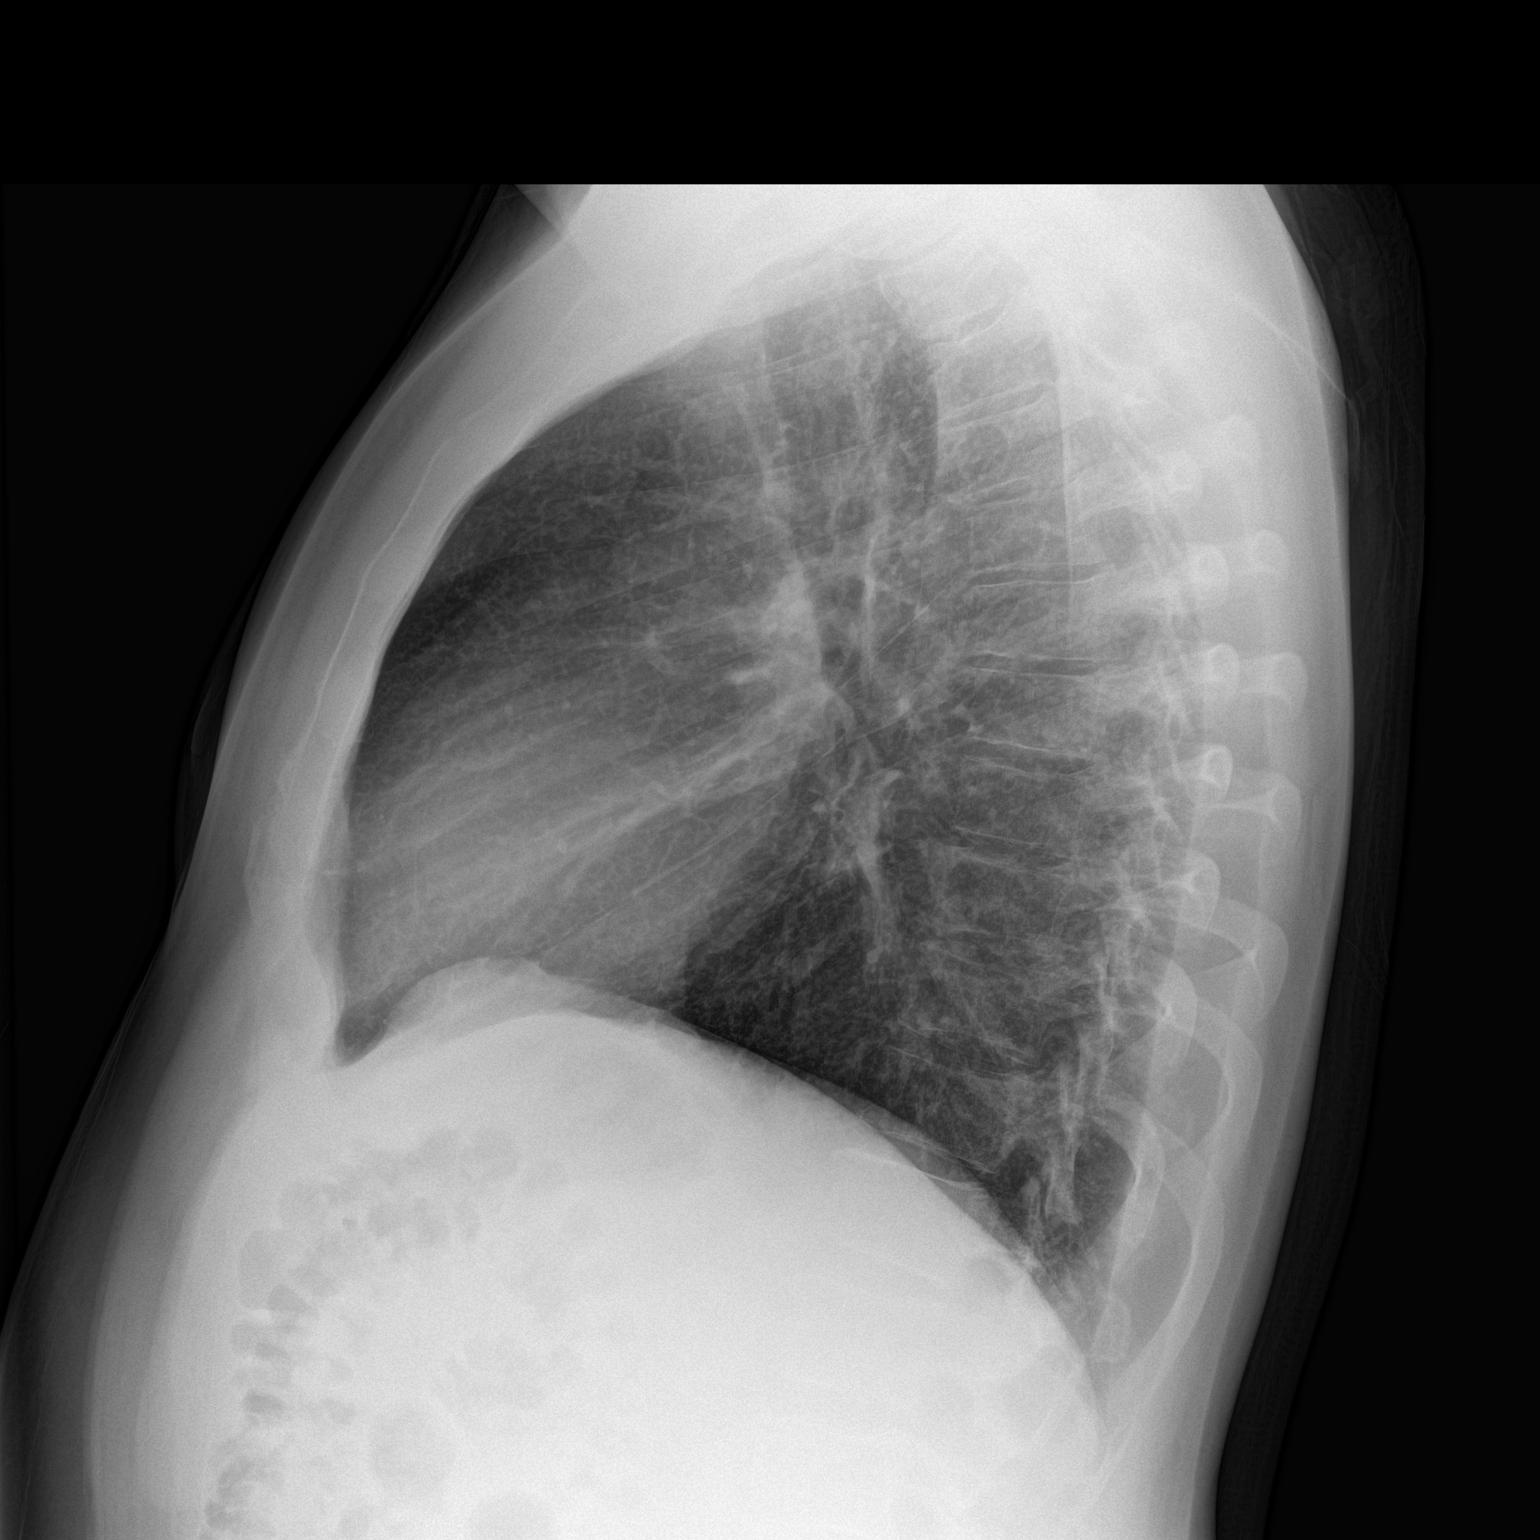

[2 of 2 positions shown; findings below may reference images not displayed]

FINDINGS: The heart size and mediastinal contours are stable. The lungs
demonstrate diffuse central airway thickening, but no hyperinflation
or confluent airspace opacity. There is no pleural effusion or
pneumothorax. The bones appear unremarkable.
IMPRESSION: Diffuse central airway thickening consistent with bronchitis. No
evidence of pneumonia.
